# Patient Record
Sex: Female | Born: 1985 | Race: White | Hispanic: No | Marital: Married | Smoking: Never smoker
Health system: Southern US, Community
[De-identification: ages and names within clinical notes are randomized; demographics above are authoritative.]

## PROBLEM LIST (undated history)

## (undated) DIAGNOSIS — O139 Gestational [pregnancy-induced] hypertension without significant proteinuria, unspecified trimester: Secondary | ICD-10-CM

## (undated) DIAGNOSIS — R51 Headache: Secondary | ICD-10-CM

## (undated) DIAGNOSIS — C801 Malignant (primary) neoplasm, unspecified: Secondary | ICD-10-CM

## (undated) DIAGNOSIS — R519 Headache, unspecified: Secondary | ICD-10-CM

## (undated) HISTORY — PX: WISDOM TOOTH EXTRACTION: SHX21

## (undated) HISTORY — PX: SKIN CANCER EXCISION: SHX779

---

## 2013-02-04 ENCOUNTER — Other Ambulatory Visit: Payer: Self-pay | Admitting: Family

## 2013-02-04 LAB — CBC WITH DIFFERENTIAL/PLATELET
Basophil %: 0.2 %
Eosinophil %: 0.8 %
HCT: 41.1 % (ref 35.0–47.0)
HGB: 14.2 g/dL (ref 12.0–16.0)
Lymphocyte %: 28.9 %
MCH: 29.4 pg (ref 26.0–34.0)
MCHC: 34.5 g/dL (ref 32.0–36.0)
MCV: 85 fL (ref 80–100)
Neutrophil %: 66 %
Platelet: 292 10*3/uL (ref 150–440)
RBC: 4.81 10*6/uL (ref 3.80–5.20)
RDW: 13.1 % (ref 11.5–14.5)
WBC: 9.1 10*3/uL (ref 3.6–11.0)

## 2013-02-04 LAB — COMPREHENSIVE METABOLIC PANEL
Albumin: 4.1 g/dL (ref 3.4–5.0)
Anion Gap: 4 — ABNORMAL LOW (ref 7–16)
BUN: 15 mg/dL (ref 7–18)
Bilirubin,Total: 0.3 mg/dL (ref 0.2–1.0)
Co2: 27 mmol/L (ref 21–32)
Creatinine: 0.64 mg/dL (ref 0.60–1.30)
EGFR (African American): >60
EGFR (Non-African Amer.): >60
Osmolality: 272 (ref 275–301)
SGPT (ALT): 34 U/L (ref 12–78)

## 2014-04-29 ENCOUNTER — Ambulatory Visit: Payer: Self-pay | Admitting: Nurse Practitioner

## 2014-06-19 ENCOUNTER — Inpatient Hospital Stay: Payer: Self-pay | Admitting: Obstetrics & Gynecology

## 2014-06-19 LAB — CBC WITH DIFFERENTIAL/PLATELET
Basophil #: 0.1 10*3/uL (ref 0.0–0.1)
Basophil %: 0.3 %
Eosinophil #: 0 10*3/uL (ref 0.0–0.7)
Eosinophil %: 0 %
HCT: 40.4 % (ref 35.0–47.0)
HGB: 13.2 g/dL (ref 12.0–16.0)
Lymphocyte #: 1.4 10*3/uL (ref 1.0–3.6)
Lymphocyte %: 5.1 %
MCH: 27.2 pg (ref 26.0–34.0)
MCHC: 32.7 g/dL (ref 32.0–36.0)
MCV: 83 fL (ref 80–100)
MONO ABS: 1.2 x10 3/mm — AB (ref 0.2–0.9)
MONOS PCT: 4.1 %
NEUTROS ABS: 25.2 10*3/uL — AB (ref 1.4–6.5)
Neutrophil %: 90.5 %
Platelet: 255 10*3/uL (ref 150–440)
RBC: 4.86 10*6/uL (ref 3.80–5.20)
RDW: 14.4 % (ref 11.5–14.5)
WBC: 27.9 10*3/uL — AB (ref 3.6–11.0)

## 2014-06-21 LAB — HEMATOCRIT: HCT: 36.7 % (ref 35.0–47.0)

## 2014-09-15 LAB — SURGICAL PATHOLOGY

## 2014-09-30 NOTE — H&P (Signed)
L&D Evaluation:  History:  H[facility] 29 [facility]2P0010 [facility] presenting for contractions starting this morning.  No LOF, no VB, +FM.  Pregnancy uncomplicated to date.  PMH noteabel for melanoma, placenta to pathology for diagnosis.   Presents with contractions   Patient's Medical History Melanoma, obesity BMI 31, IBS   Patient's Surgical History colonoscopy   Medications Pre Natal Vitamins   Allergies NKDA   Social History none   Family History Non-Contributory   ROS:  ROS All systems were reviewed.  HEENT, CNS, GI, GU, Respiratory, CV, Renal and Musculoskeletal systems were found to be normal.   Exam:  Vital Signs stable   Urine Protein not completed   General painfully contracting   Mental Status clear   Chest no increased work of breathing   Abdomen gravid, tender with contractions   Estimated Fetal Weight Average for gestational age   Fetal Position vtx   Back no CVAT   Pelvic 3/50/-3   Mebranes Intact   FHT normal rate with no decels   Ucx regular, q578m   Impression:  Impression 2610o G2P0010 at 4074w4dstation with braxton hick contractions vs early labor   Plan:  Plan EFM/NST, monitor contractions and for cervical change   Comments 1) R/O labor - unchanged over the last 4-hrs, relatively uncomfortable will recheck in 1-hr - additional 6mg16m morpine received 10mg21mrs ago with partial relieve of symptoms  2) Fetus - category I tracing  3) AB positive / ABSC neg / RNI / VZI / HIV neg / HBsAg neg / GBS neg 05/21/15  4) Dispositon pending recheck - needs MMR   Electronic Signatures: StaebDorthula Nettles  (Signed 28-Jan-16 16:07)  Authored: L&D Evaluation   Last Updated: 28-Jan-16 16:07 by StaebDorthula Nettles

## 2017-11-27 NOTE — Telephone Encounter (Signed)
This encounter was created in error - please disregard.

## 2018-05-23 NOTE — L&D Delivery Note (Signed)
Date of delivery: 01/03/2019 Estimated Date of Delivery: 12/31/18 Patient's last menstrual period was 03/26/2018. EGA: [redacted]w[redacted]d  Delivery Note At 3:09 AM a viable female was delivered via Vaginal, Spontaneous (Presentation: cephalic; ROA).  APGAR: 8, 9; weight pending skin-to-skin.   Placenta status: spontaneous, intact, with eccenric cord insertion, normal appearing vascularture, slightly pale, with multiple infarcts on maternal surface.  No evidence of melanoma on either surface.  Cord: 3vv,  with the following complications: none apparent. Thick tarry meconium.  Cord pH: declined by peds  Anesthesia:  epidural Episiotomy: None Lacerations: Labial (left) Suture Repair: 3.0 vicryl rapide Est. Blood Loss (mL): 3cc   Mom presented to L&D with perisistent high blood pressures in the office.  She was given cytotec buccally and cervically for cervical ripening, and spontaneous labor progressed from there.  epidual placed. AROM'd for particulate meconium.  Progressed to complete, second stage:  delivery of fetal head with restitution to ROT.   Anterior then posterior shoulders delivered without difficulty, with thick tarry meconium following.  Baby placed on mom's chest, and attended to by peds.  Cord was then clamped and cut by FOB when pulseless.  Placenta spontaneously delivered, intact.   IV pitocin given for hemorrhage prophylaxis. Vaginal and left labial lac repaired with 3-0 rapide in a continuous stitch.    We sang happy birthday to baby Glennon Mac.  Mom to postpartum.  Baby to Couplet care / Skin to Skin.  Chelsea C Ward 01/03/2019, 4:21 AM

## 2018-06-18 ENCOUNTER — Other Ambulatory Visit: Payer: Self-pay | Admitting: Certified Nurse Midwife

## 2018-06-18 DIAGNOSIS — Z369 Encounter for antenatal screening, unspecified: Secondary | ICD-10-CM

## 2018-06-28 ENCOUNTER — Ambulatory Visit (HOSPITAL_BASED_OUTPATIENT_CLINIC_OR_DEPARTMENT_OTHER)
Admission: RE | Admit: 2018-06-28 | Discharge: 2018-06-28 | Disposition: A | Discharge status: Home / Self Care | Payer: BC Managed Care – PPO | Source: Ambulatory Visit | Attending: Maternal & Fetal Medicine | Admitting: Maternal & Fetal Medicine

## 2018-06-28 ENCOUNTER — Ambulatory Visit
Admission: RE | Admit: 2018-06-28 | Discharge: 2018-06-28 | Disposition: A | Discharge status: Home / Self Care | Payer: BC Managed Care – PPO | Source: Ambulatory Visit | Attending: Maternal & Fetal Medicine | Admitting: Maternal & Fetal Medicine

## 2018-06-28 VITALS — BP 133/70 | HR 92 | Temp 98.0°F | Resp 18 | Wt 192.0 lb

## 2018-06-28 DIAGNOSIS — Z369 Encounter for antenatal screening, unspecified: Secondary | ICD-10-CM

## 2018-06-28 DIAGNOSIS — Z3682 Encounter for antenatal screening for nuchal translucency: Secondary | ICD-10-CM | POA: Diagnosis present

## 2018-06-28 DIAGNOSIS — Z3A13 13 weeks gestation of pregnancy: Secondary | ICD-10-CM | POA: Insufficient documentation

## 2018-06-28 HISTORY — DX: Headache: R51

## 2018-06-28 HISTORY — DX: Malignant (primary) neoplasm, unspecified: C80.1

## 2018-06-28 HISTORY — DX: Headache, unspecified: R51.9

## 2018-06-28 NOTE — Progress Notes (Signed)
Referring physician:  Laverta Baltimore Length of Consultation: 40 minutes   Karen Wise  was referred to [facility] for genetic counseling to review prenatal screening and testing options.  This note summarizes the information we discussed.    We offered the following routine screening tests for this pregnancy:  First trimester screening, which includes nuchal translucency ultrasound screen and first trimester maternal serum marker screening.  The nuchal translucency has approximately an 80% detection rate for Down syndrome and can be positive for other chromosome abnormalities as well as congenital heart defects.  When combined with a maternal serum marker screening, the detection rate is up to 90% for Down syndrome and up to 97% for trisomy 18.     Maternal serum marker screening, a blood test that measures pregnancy proteins, can provide risk assessments for Down syndrome, trisomy 18, and open neural tube defects (spina bifida, anencephaly). Because it does not directly examine the fetus, it cannot positively diagnose or rule out these problems.  Targeted ultrasound uses high frequency sound waves to create an image of the developing fetus.  An ultrasound is often recommended as a routine means of evaluating the pregnancy.  It is also used to screen for fetal anatomy problems (for example, a heart defect) that might be suggestive of a chromosomal or other abnormality.   Should these screening tests indicate an increased concern, then the following additional testing options would be offered:  The chorionic villus sampling procedure is available for first trimester chromosome analysis.  This involves the withdrawal of a small amount of chorionic villi (tissue from the developing placenta).  Risk of pregnancy loss is estimated to be approximately 1 in 200 to 1 in 100 (0.5 to 1%).  There is approximately a 1% (1 in 100) chance that the CVS chromosome results will be  unclear.  Chorionic villi cannot be tested for neural tube defects.     Amniocentesis involves the removal of a small amount of amniotic fluid from the sac surrounding the fetus with the use of a thin needle inserted through the maternal abdomen and uterus.  Ultrasound guidance is used throughout the procedure.  Fetal cells from amniotic fluid are directly evaluated and > 99.5% of chromosome problems and > 98% of open neural tube defects can be detected. This procedure is generally performed after the 15th week of pregnancy.  The main risks to this procedure include complications leading to miscarriage in less than 1 in 200 cases (0.5%).  As another option for information if the pregnancy is suspected to be an an increased chance for certain chromosome conditions, we also reviewed the availability of cell free fetal DNA testing from maternal blood to determine whether or not the baby may have either Down syndrome, trisomy 21, or trisomy 30.  This test utilizes a maternal blood sample and DNA sequencing technology to isolate circulating cell free fetal DNA from maternal plasma.  The fetal DNA can then be analyzed for DNA sequences that are derived from the three most common chromosomes involved in aneuploidy, chromosomes 13, 18, and 21.  If the overall amount of DNA is greater than the expected level for any of these chromosomes, aneuploidy is suspected.  While we do not consider it a replacement for invasive testing and karyotype analysis, a negative result from this testing would be reassuring, though not a guarantee of a normal chromosome complement for the baby.  An abnormal result is certainly suggestive of an abnormal chromosome complement, though  we would still recommend CVS or amniocentesis to confirm any findings from this testing.  Cystic Fibrosis and Spinal Muscular Atrophy (SMA) screening were also discussed with the patient. Both conditions are recessive, which means that both parents must be  carriers in order to have a child with the disease.  Cystic fibrosis (CF) is one of the most common genetic conditions in persons of Caucasian ancestry.  This condition occurs in approximately 1 in 2,500 Caucasian persons and results in thickened secretions in the lungs, digestive, and reproductive systems.  For a baby to be at risk for having CF, both of the parents must be carriers for this condition.  Approximately 1 in 62 Caucasian persons is a carrier for CF.  Current carrier testing looks for the most common mutations in the gene for CF and can detect approximately 90% of carriers in the Caucasian population.  This means that the carrier screening can greatly reduce, but cannot eliminate, the chance for an individual to have a child with CF.  If an individual is found to be a carrier for CF, then carrier testing would be available for the partner. As part of [place] newborn screening profile, all babies born in the state of [place] will have a two-tier screening process.  Specimens are first tested to determine the concentration of immunoreactive trypsinogen (IRT).  The top 5% of specimens with the highest IRT values then undergo DNA testing using a panel of over 40 common CF mutations. SMA is a neurodegenerative disorder that leads to atrophy of skeletal muscle and overall weakness.  This condition is also more prevalent in the Caucasian population, with 1 in 40-1 in 60 persons being a carrier and 1 in 6,000-1 in 10,000 children being affected.  There are multiple forms of the disease, with some causing death in infancy to other forms with survival into adulthood.  The genetics of SMA is complex, but carrier screening can detect up to 95% of carriers in the Caucasian population.  Similar to CF, a negative result can greatly reduce, but cannot eliminate, the chance to have a child with SMA.  We obtained a detailed family history and pregnancy history.  Ms. Mullings reported that she and her  father have both had melanoma and several other family members have had multiple other types of skin cancer.  In addition, her paternal grandmother had both breast and ovarian cancer, though it is unclear if this was two different primary tumors or a metastasis.  Her maternal grandmother had colon cancer.  We reviewed that most cancers occur by chance.  When there is a strong family history of cancers, particularly if individuals are diagnosed at young ages or multiple family members have the same type of cancer, there is more likely to be an inherited predisposition.  If at any time Ms. Meine would like to speak with a cancer genetic counselor about this history, we would be happy to provide her with contact information for a hereditary cancer clinic. Also of note, the father of the baby is said to have had several paternal family members with brain aneurisms including both grandparents and an aunt or uncle.  We noted that there are some families with an inherited cause for aneurism and if more is learned about the cause for there conditions we are happy to discuss this further. The remainder of the family history was reported to be unremarkable for birth defects, intellectual delays, recurrent pregnancy loss or known chromosome abnormalities.  Ms. Hammer reported  that this is the third pregnancy for she and her partner, Everlean Patterson.  Their first pregnancy resulted in an early miscarriage, and they have a 44 year old son who is in good health.  She reported no complications or exposures in this pregnancy that would be expected to increase the risk for birth defects.  After consideration of the options, Ms. Pippins elected to proceed with first trimester screening.  She declined carrier screening for CF and SMA.  An ultrasound was performed at the time of the visit.  The gestational age was consistent with 41 weeks.  Fetal anatomy could not be assessed due to early gestational age.  Please refer to the ultrasound  report for details of that study.  Ms. Longo was encouraged to call with questions or concerns.  We can be contacted at [phone].  Labs ordered: first trimester screening  Wilburt Finlay, MS, CGC

## 2018-07-03 ENCOUNTER — Telehealth: Payer: Self-pay | Admitting: Obstetrics and Gynecology

## 2018-07-03 NOTE — Telephone Encounter (Signed)
The results of the First Trimester Nuchal Translucency and Biochemical Screening are now available.  These results showed an increased risk for Down Syndrome.  Specifically, the risk for Down syndrome is now 1 in 66 (the prior risk based upon age alone was 1 in 73).  The risk for Trisomy 13/18 is estimated to be 1 in 2,595.  As we discussed when we called with these results, if more definitive information is desired, we would offer chorionic villus sampling or amniocentesis.  Because we do not yet know the effectiveness of combined first and second trimester screening, we do not recommend a maternal serum screen to assess the chance for chromosome conditions.  However, if screening for neural tube defects is desired, maternal serum screening for AFP only can be performed between 15 and [redacted] weeks gestation.    We also reviewed the availability of cell free fetal DNA testing from maternal blood to determine whether or not the baby may have either Down syndrome, trisomy 44, or trisomy 71.  This test utilizes a maternal blood sample and DNA sequencing technology to isolate circulating cell free fetal DNA from maternal plasma.  The fetal DNA can then be analyzed for DNA sequences that are derived from the three most common chromosomes involved in aneuploidy, chromosomes 13, 18, and 21.  If the overall amount of DNA is greater than the expected level for any of these chromosomes, aneuploidy is suspected.  While we do not consider it a replacement for invasive testing and karyotype analysis, a negative result from this testing would be reassuring, though not a guarantee of a normal chromosome complement for the baby.  An abnormal result is certainly suggestive of an abnormal chromosome complement, though we would still recommend CVS or amniocentesis to confirm any findings from this testing  The patient elected to return to [facility] on 07[facility]/18 to review the results and have MaterniT21 testing  drawn.  The patient was encouraged to contact us with any questions.  We may be reached at [phone].   Wilburt Finlay, MS, CGC

## 2018-07-05 ENCOUNTER — Ambulatory Visit
Admission: RE | Admit: 2018-07-05 | Discharge: 2018-07-05 | Disposition: A | Discharge status: Home / Self Care | Payer: BC Managed Care – PPO | Source: Ambulatory Visit | Attending: Maternal & Fetal Medicine | Admitting: Maternal & Fetal Medicine

## 2018-07-05 DIAGNOSIS — O281 Abnormal biochemical finding on antenatal screening of mother: Secondary | ICD-10-CM | POA: Insufficient documentation

## 2018-07-05 NOTE — Progress Notes (Signed)
I met with Ms. Leiber and her partner to review the results of first trimester screening which showed a risk of 1 in 12 for Down syndrome.  We reviewed the option of cell free fetal DNA testing, amniocentesis or ultrasound to provide additional risk assessment.  They elected to proceed with MaterniT21 PLUS with SCA testing today and will consider amniocentesis pending those results. Legal limits for decision making in pregnancy were also discussed.   Results should be available in approximately 1 week and will be conveyed directly to the patient. We may be reached by phone at [phone].  Wilburt Finlay, MS, CGC

## 2018-07-10 LAB — MATERNIT21 PLUS CORE+SCA
Chromosome 13: NEGATIVE
Chromosome 18: NEGATIVE
Chromosome 21: NEGATIVE
Y Chromosome: DETECTED

## 2018-07-12 ENCOUNTER — Telehealth: Payer: Self-pay | Admitting: Obstetrics and Gynecology

## 2018-07-12 NOTE — Telephone Encounter (Signed)
The patient was informed of the results of her recent MaterniT21 testing which yielded NEGATIVE results.  The patient's specimen showed DNA consistent with two copies of chromosomes 21, 18 and 13.  The sensitivity for trisomy 31, trisomy 7 and trisomy 3 using this testing are reported as 99.1%, 99.9% and 91.7% respectively.  Thus, while the results of this testing are highly accurate, they are not considered diagnostic at this time.  Should more definitive information be desired, the patient may still consider amniocentesis.   As requested to know by the patient, sex chromosome analysis was included for this sample.  Results are consistent with a female fetus (Y chromosome material was detected). This is predicted with >99% accuracy.  A maternal serum AFP only should be considered if screening for neural tube defects is desired.  After consideration of these results following her abnormal first trimester screening, Ms. Carrell declined any additional testing for chromosome conditions at this time.  She plans to continue with her anatomy ultrasound at [facility] and will contact our office if needed. We may be reached at [phone] with any questions or concerns.   Wilburt Finlay, MS, CGC

## 2018-07-16 NOTE — Progress Notes (Signed)
Pt seen by me, agree with assessment and plan as outlined in [facility]'s note.

## 2018-12-12 ENCOUNTER — Observation Stay
Admission: EM | Admit: 2018-12-12 | Discharge: 2018-12-12 | Disposition: A | Discharge status: Home / Self Care | Payer: BC Managed Care – PPO | Attending: Obstetrics and Gynecology | Admitting: Obstetrics and Gynecology

## 2018-12-12 ENCOUNTER — Other Ambulatory Visit: Payer: Self-pay | Admitting: Obstetrics and Gynecology

## 2018-12-12 ENCOUNTER — Other Ambulatory Visit: Payer: Self-pay

## 2018-12-12 DIAGNOSIS — Z85828 Personal history of other malignant neoplasm of skin: Secondary | ICD-10-CM | POA: Diagnosis not present

## 2018-12-12 DIAGNOSIS — Z3A37 37 weeks gestation of pregnancy: Secondary | ICD-10-CM | POA: Insufficient documentation

## 2018-12-12 DIAGNOSIS — R42 Dizziness and giddiness: Secondary | ICD-10-CM | POA: Insufficient documentation

## 2018-12-12 DIAGNOSIS — O26893 Other specified pregnancy related conditions, third trimester: Secondary | ICD-10-CM | POA: Diagnosis not present

## 2018-12-12 DIAGNOSIS — R030 Elevated blood-pressure reading, without diagnosis of hypertension: Secondary | ICD-10-CM | POA: Diagnosis present

## 2018-12-12 LAB — COMPREHENSIVE METABOLIC PANEL
ALT: 9 U/L (ref 0–44)
AST: 13 U/L — ABNORMAL LOW (ref 15–41)
Albumin: 2.9 g/dL — ABNORMAL LOW (ref 3.5–5.0)
Alkaline Phosphatase: 77 U/L (ref 38–126)
Anion gap: 9 (ref 5–15)
BUN: 8 mg/dL (ref 6–20)
CO2: 20 mmol/L — ABNORMAL LOW (ref 22–32)
Calcium: 8.4 mg/dL — ABNORMAL LOW (ref 8.9–10.3)
Chloride: 106 mmol/L (ref 98–111)
Creatinine, Ser: 0.51 mg/dL (ref 0.44–1.00)
GFR calc Af Amer: >60 mL/min (ref >60)
GFR calc non Af Amer: >60 mL/min (ref >60)
Glucose, Bld: 120 mg/dL — ABNORMAL HIGH (ref 70–99)
Potassium: 3.5 mmol/L (ref 3.5–5.1)
Sodium: 135 mmol/L (ref 135–145)
Total Bilirubin: 0.2 mg/dL — ABNORMAL LOW (ref 0.3–1.2)
Total Protein: 6.3 g/dL — ABNORMAL LOW (ref 6.5–8.1)

## 2018-12-12 LAB — CBC
HCT: 31.9 % — ABNORMAL LOW (ref 36.0–46.0)
Hemoglobin: 10.3 g/dL — ABNORMAL LOW (ref 12.0–15.0)
MCH: 25.8 pg — ABNORMAL LOW (ref 26.0–34.0)
MCHC: 32.3 g/dL (ref 30.0–36.0)
MCV: 79.8 fL — ABNORMAL LOW (ref 80.0–100.0)
Platelets: 273 10*3/uL (ref 150–400)
RBC: 4 MIL/uL (ref 3.87–5.11)
RDW: 13.5 % (ref 11.5–15.5)
WBC: 13.4 10*3/uL — ABNORMAL HIGH (ref 4.0–10.5)
nRBC: 0 % (ref 0.0–0.2)

## 2018-12-12 LAB — SAMPLE TO BLOOD BANK

## 2018-12-12 LAB — PROTEIN / CREATININE RATIO, URINE
Creatinine, Urine: 101 mg/dL
Protein Creatinine Ratio: 0.08 mg/mg{Cre} (ref 0.00–0.15)
Total Protein, Urine: 8 mg/dL

## 2018-12-12 NOTE — Progress Notes (Signed)
Elevated BP in office today. Sent to triage for [facility] eval.

## 2018-12-12 NOTE — Discharge Summary (Signed)
Patient ID: Karen Wise, female   DOB: [birth year], 33 y.o.   MRN: [id number]     Subjective    Karen Wise is a 33 y.o. female. She is at [redacted]w[redacted]d gestation. Patient's last menstrual period was 03/26/2018.  Estimated Date of Delivery: 12/31/18     Prenatal care site: [facility]   Chief complaint:dizziness, RUQ pain      Location:  Onset/timing:  Duration:  Quality:   Severity:  Aggravating or alleviating conditions:  Associated signs/symptoms:  Context:     S: Resting comfortably. no CTX, no VB.no LOF,  Active fetal movement.       Maternal Medical History:            Past Medical History:    Diagnosis   Date    .   Cancer [facility])            Melonoma 2009    .   Headache            Migraines (1 a month)               Past Surgical History:    Procedure   Laterality   Date    .   SKIN CANCER EXCISION   Left            Shoulder and Lymph Nodes    .   WISDOM TOOTH EXTRACTION   Bilateral                  Allergies    Allergen   Reactions    .   Bee Venom   Swelling                 Prior to Admission medications     Medication   Sig   Start Date   End Date   Taking?   Authorizing Provider    pantoprazole (PROTONIX) 40 MG tablet   Take 40 mg by mouth daily.           Yes   [provider]    Prenatal Vit-Fe Fumarate-FA (PRENATAL MULTIVITAMIN) TABS tablet   Take 1 tablet by mouth daily at 12 noon.           Yes   [provider]    ondansetron (ZOFRAN-ODT) 4 MG disintegrating tablet   Take 4 mg by mouth every 8 (eight) hours as needed for nausea or vomiting.               [provider]             Social History: She  reports that she has never smoked. She has never used smokeless tobacco. She reports previous alcohol use. She reports that she does not use  drugs.     Family History: family history includes ADD / ADHD in her brother and brother; Cancer in her father, maternal grandmother, mother, and paternal grandmother; Heart disease in her maternal grandfather; Hypertension in her maternal grandfather.  no history of gyn cancers     Review of Systems: A full review of systems was performed and negative except as noted in the HPI.       Review of Systems: A full review of systems was performed and negative except as noted in the HPI.    Eyes: no vision change   Ears: left ear pain   Oropharynx: no sore throat   Pulmonary . No shortness of breath , no hemoptysis  Cardiovascular: no chest pain , no irregular heart beat   Gastrointestinal:no blood in stool . No diarrhea, no constipation  Uro gynecologic: no dysuria , no pelvic pain  Neurologic : no seizure , no migraines     Musculoskeletal: no muscular weakness     O:       Objective                                                                                                                                                                                                                                                                                                                                                                                                                                                                                                                                                         Constitutional: NAD, AAOx3   HE/ENT: extraocular  movements grossly intact, moist mucous  membranes  CV: RRR  PULM: nl respiratory effort, CTABL                                          Abd: gravid, non-tender, non-distended, soft                                                   Ext: Non-tender, Nonedmeatous                      Psych: mood appropriate, speech normal  Pelvic deferred     NST:- reactive   Baseline: 140  Variability: moderate  Accelerations present x >2  Decelerations absent  Time 67mins           Assessment: 33 y.o. [redacted]w[redacted]d here for antenatal surveillance during pregnancy.     Principle diagnosis: dizziness     Plan:  .Labor: not present.  No evidence of Preeclampsia    .Fetal Wellbeing: Reassuring Cat 1 tracing.   .Reactive NST    .D/c home stable, precautions reviewed, follow-up as scheduled.    Precautions given   -----  Huel Cote MD  Attending Obstetrician and Gynecologist  [facility], Department of [facility]             Electronically signed by Kourtlynn Trevor, Gwen Her, MD at 12/12/2018  5:27 PM

## 2018-12-12 NOTE — OB Triage Note (Signed)
Provider reviewed labs and okay to discharge. Pt verbalized understanding of discharge instructions and has no further questions at this time.

## 2018-12-12 NOTE — Final Progress Note (Signed)
Patient ID: Karen Wise, female   DOB: [birth year], 33 y.o.   MRN: [id number]  Karen Wise is a 33 y.o. female. She is at [redacted]w[redacted]d gestation. Patient's last menstrual period was 03/26/2018. Estimated Date of Delivery: 12/31/18  Prenatal care site: [facility]  Chief complaint:dizziness, RUQ pain   Location: Onset/timing: Duration: Quality:  Severity: Aggravating or alleviating conditions: Associated signs/symptoms: Context:  S: Resting comfortably. no CTX, no VB.no LOF,  Active fetal movement.   Maternal Medical History:   Past Medical History:  Diagnosis Date  . Cancer [facility])    Melonoma 2009  . Headache    Migraines (1 a month)    Past Surgical History:  Procedure Laterality Date  . SKIN CANCER EXCISION Left    Shoulder and Lymph Nodes  . WISDOM TOOTH EXTRACTION Bilateral     Allergies  Allergen Reactions  . Bee Venom Swelling    Prior to Admission medications   Medication Sig Start Date End Date Taking? Authorizing Provider  pantoprazole (PROTONIX) 40 MG tablet Take 40 mg by mouth daily.   Yes [provider]  Prenatal Vit-Fe Fumarate-FA (PRENATAL MULTIVITAMIN) TABS tablet Take 1 tablet by mouth daily at 12 noon.   Yes [provider]  ondansetron (ZOFRAN-ODT) 4 MG disintegrating tablet Take 4 mg by mouth every 8 (eight) hours as needed for nausea or vomiting.    [provider]     Social History: She  reports that she has never smoked. She has never used smokeless tobacco. She reports previous alcohol use. She reports that she does not use drugs.  Family History: family history includes ADD / ADHD in her brother and brother; Cancer in her father, maternal grandmother, mother, and paternal grandmother; Heart disease in her maternal grandfather; Hypertension in her maternal grandfather.  no history of gyn cancers  Review of Systems: A full review of systems was performed and negative except as noted in the HPI.     Review of Systems: A full review of systems was performed and negative except as noted in the HPI.   Eyes: no vision change  Ears: left ear pain  Oropharynx: no sore throat  Pulmonary . No shortness of breath , no hemoptysis Cardiovascular: no chest pain , no irregular heart beat  Gastrointestinal:no blood in stool . No diarrhea, no constipation Uro gynecologic: no dysuria , no pelvic pain Neurologic : no seizure , no migraines    Musculoskeletal: no muscular weakness  O:  BP 130/80   Pulse 85   Temp 98.7 F (37.1 C) (Oral)   Resp 16   Ht 5\' 6"  (1.676 m)   Wt 102.1 kg   LMP 03/26/2018   BMI 36.32 kg/m  Results for orders placed or performed during the hospital encounter of 12/12/18 (from the past 48 hour(s))  Protein / creatinine ratio, urine   Collection Time: 12/12/18  3:04 PM  Result Value Ref Range   Creatinine, Urine 101 mg/dL   Total Protein, Urine 8 mg/dL   Protein Creatinine Ratio 0.08 0.00 - 0.15 mg/mg[Cre]  CBC on admission   Collection Time: 12/12/18  3:28 PM  Result Value Ref Range   WBC 13.4 (H) 4.0 - 10.5 K/uL   RBC 4.00 3.87 - 5.11 MIL/uL   Hemoglobin 10.3 (L) 12.0 - 15.0 g/dL   HCT 31.9 (L) 36.0 - 46.0 %   MCV 79.8 (L) 80.0 - 100.0 fL   MCH 25.8 (L) 26.0 - 34.0 pg   MCHC 32.3  30.0 - 36.0 g/dL   RDW 13.5 11.5 - 15.5 %   Platelets 273 150 - 400 K/uL   nRBC 0.0 0.0 - 0.2 %  Comprehensive metabolic panel   Collection Time: 12/12/18  3:28 PM  Result Value Ref Range   Sodium 135 135 - 145 mmol/L   Potassium 3.5 3.5 - 5.1 mmol/L   Chloride 106 98 - 111 mmol/L   CO2 20 (L) 22 - 32 mmol/L   Glucose, Bld 120 (H) 70 - 99 mg/dL   BUN 8 6 - 20 mg/dL   Creatinine, Ser 0.51 0.44 - 1.00 mg/dL   Calcium 8.4 (L) 8.9 - 10.3 mg/dL   Total Protein 6.3 (L) 6.5 - 8.1 g/dL   Albumin 2.9 (L) 3.5 - 5.0 g/dL   AST 13 (L) 15 - 41 U/L   ALT 9 0 - 44 U/L   Alkaline Phosphatase 77 38 - 126 U/L   Total Bilirubin 0.2 (L) 0.3 - 1.2 mg/dL   GFR calc non Af Amer >60 >60  mL/min   GFR calc Af Amer >60 >60 mL/min   Anion gap 9 5 - 15  Sample to Blood Bank   Collection Time: 12/12/18  3:29 PM  Result Value Ref Range   Blood Bank Specimen SAMPLE AVAILABLE FOR TESTING    Sample Expiration      12/15/2018,2359 Performed at [facility], [address] [place], [place] [address]      Constitutional: NAD, AAOx3  HE/ENT: extraocular movements grossly intact, moist mucous membranes CV: RRR PULM: nl respiratory effort, CTABL     Abd: gravid, non-tender, non-distended, soft      Ext: Non-tender, Nonedmeatous   Psych: mood appropriate, speech normal Pelvic deferred  NST:  Baseline:  Variability: moderate Accelerations present x >2 Decelerations absent Time 2mins    Assessment: 33 y.o. [redacted]w[redacted]d here for antenatal surveillance during pregnancy.  Principle diagnosis: dizziness  Plan:  Labor: not present.  No evidence of Preeclampsia   Fetal Wellbeing: Reassuring Cat 1 tracing.  Reactive NST   D/c home stable, precautions reviewed, follow-up as scheduled.  Precautions given  ----- Huel Cote MD Attending Obstetrician and Gynecologist [facility], Department of [facility]

## 2018-12-12 NOTE — OB Triage Note (Addendum)
Pt is a 33yo G3P1 at [redacted]w[redacted]d that was sent over from office for PIH eval. Pt states she has had headaches and dizziness on and off since Saturday. Pt states she has taken tylenol at home that gives temporary relief but she states "she feels guilty for taking meds so I don't like to take them." Pt states she has blurred vision that relates to the dizziness. Pt wears contacts. Pt also reports epigastric pain. Pt denies VB, LOF and states positive fetal movement. Initial BP 143/81 and cycling q 52min. Initial FHT 145 with monitors applied and assessing. Reflexes plus 2, no clonus, no pitting edema noted on exam.

## 2019-01-02 ENCOUNTER — Other Ambulatory Visit: Payer: Self-pay

## 2019-01-02 ENCOUNTER — Inpatient Hospital Stay
Admission: RE | Admit: 2019-01-02 | Discharge: 2019-01-04 | DRG: 807 | Disposition: A | Discharge status: Home / Self Care | Payer: BC Managed Care – PPO | Attending: Obstetrics & Gynecology | Admitting: Obstetrics & Gynecology

## 2019-01-02 DIAGNOSIS — O480 Post-term pregnancy: Secondary | ICD-10-CM | POA: Diagnosis present

## 2019-01-02 DIAGNOSIS — R030 Elevated blood-pressure reading, without diagnosis of hypertension: Secondary | ICD-10-CM | POA: Diagnosis present

## 2019-01-02 DIAGNOSIS — Z85820 Personal history of malignant melanoma of skin: Secondary | ICD-10-CM

## 2019-01-02 DIAGNOSIS — O99214 Obesity complicating childbirth: Secondary | ICD-10-CM | POA: Diagnosis present

## 2019-01-02 DIAGNOSIS — Z20828 Contact with and (suspected) exposure to other viral communicable diseases: Secondary | ICD-10-CM | POA: Diagnosis present

## 2019-01-02 DIAGNOSIS — O43123 Velamentous insertion of umbilical cord, third trimester: Secondary | ICD-10-CM | POA: Diagnosis present

## 2019-01-02 DIAGNOSIS — E669 Obesity, unspecified: Secondary | ICD-10-CM | POA: Diagnosis present

## 2019-01-02 DIAGNOSIS — Z3A40 40 weeks gestation of pregnancy: Secondary | ICD-10-CM | POA: Diagnosis not present

## 2019-01-02 DIAGNOSIS — O26893 Other specified pregnancy related conditions, third trimester: Secondary | ICD-10-CM | POA: Diagnosis present

## 2019-01-02 HISTORY — DX: Gestational (pregnancy-induced) hypertension without significant proteinuria, unspecified trimester: O13.9

## 2019-01-02 LAB — PROTEIN / CREATININE RATIO, URINE
Creatinine, Urine: 42 mg/dL
Total Protein, Urine: <6 mg/dL

## 2019-01-02 LAB — CBC
HCT: 32 % — ABNORMAL LOW (ref 36.0–46.0)
Hemoglobin: 10.3 g/dL — ABNORMAL LOW (ref 12.0–15.0)
MCH: 25.3 pg — ABNORMAL LOW (ref 26.0–34.0)
MCHC: 32.2 g/dL (ref 30.0–36.0)
MCV: 78.6 fL — ABNORMAL LOW (ref 80.0–100.0)
Platelets: 272 10*3/uL (ref 150–400)
RBC: 4.07 MIL/uL (ref 3.87–5.11)
RDW: 14.3 % (ref 11.5–15.5)
WBC: 16.4 10*3/uL — ABNORMAL HIGH (ref 4.0–10.5)
nRBC: 0 % (ref 0.0–0.2)

## 2019-01-02 LAB — SARS CORONAVIRUS 2 BY RT PCR (HOSPITAL ORDER, PERFORMED IN ~~LOC~~ HOSPITAL LAB): SARS Coronavirus 2: NEGATIVE

## 2019-01-02 LAB — TYPE AND SCREEN
ABO/RH(D): AB POS
Antibody Screen: NEGATIVE

## 2019-01-02 MED ORDER — ONDANSETRON HCL 4 MG/2ML IJ SOLN
4.0000 mg | Freq: Four times a day (QID) | INTRAMUSCULAR | Status: DC | PRN
  Administered 2019-01-03: 4 mg via INTRAVENOUS
  Filled 2019-01-02: qty 2

## 2019-01-02 MED ORDER — ACETAMINOPHEN 500 MG PO TABS: 1000.0000 mg | ORAL_TABLET | Freq: Four times a day (QID) | ORAL | Status: DC | PRN

## 2019-01-02 MED ORDER — BUTORPHANOL TARTRATE 1 MG/ML IJ SOLN: 1.0000 mg | INTRAMUSCULAR | Status: DC | PRN

## 2019-01-02 MED ORDER — MISOPROSTOL 200 MCG PO TABS
ORAL_TABLET | ORAL | Status: AC
  Filled 2019-01-02: qty 4

## 2019-01-02 MED ORDER — FENTANYL 2.5 MCG/ML W/ROPIVACAINE 0.15% IN NS 100 ML EPIDURAL (ARMC)
EPIDURAL | Status: AC
  Filled 2019-01-02: qty 100

## 2019-01-02 MED ORDER — LIDOCAINE HCL (PF) 1 % IJ SOLN
INTRAMUSCULAR | Status: AC
  Filled 2019-01-02: qty 30

## 2019-01-02 MED ORDER — OXYTOCIN BOLUS FROM INFUSION
500.0000 mL | Freq: Once | INTRAVENOUS | Status: AC
  Administered 2019-01-03: 500 mL via INTRAVENOUS

## 2019-01-02 MED ORDER — LIDOCAINE HCL (PF) 1 % IJ SOLN: 30.0000 mL | INTRAMUSCULAR | Status: DC | PRN

## 2019-01-02 MED ORDER — MISOPROSTOL 25 MCG QUARTER TABLET
25.0000 ug | ORAL_TABLET | ORAL | Status: DC
  Administered 2019-01-02: 25 ug via BUCCAL
  Filled 2019-01-02 (×2): qty 1

## 2019-01-02 MED ORDER — LACTATED RINGERS IV SOLN: 500.0000 mL | INTRAVENOUS | Status: DC | PRN

## 2019-01-02 MED ORDER — TERBUTALINE SULFATE 1 MG/ML IJ SOLN: 0.2500 mg | Freq: Once | INTRAMUSCULAR | Status: DC | PRN

## 2019-01-02 MED ORDER — AMMONIA AROMATIC IN INHA
RESPIRATORY_TRACT | Status: AC
  Filled 2019-01-02: qty 10

## 2019-01-02 MED ORDER — LACTATED RINGERS IV SOLN
INTRAVENOUS | Status: DC
  Administered 2019-01-02: 20:00:00 via INTRAVENOUS

## 2019-01-02 MED ORDER — OXYTOCIN 10 UNIT/ML IJ SOLN
INTRAMUSCULAR | Status: AC
  Filled 2019-01-02: qty 2

## 2019-01-02 MED ORDER — SOD CITRATE-CITRIC ACID 500-334 MG/5ML PO SOLN: 30.0000 mL | ORAL | Status: DC | PRN

## 2019-01-02 MED ORDER — PROCHLORPERAZINE MALEATE 10 MG PO TABS
10.0000 mg | ORAL_TABLET | Freq: Four times a day (QID) | ORAL | Status: DC | PRN
  Administered 2019-01-02: 10 mg via ORAL
  Filled 2019-01-02 (×3): qty 1

## 2019-01-02 MED ORDER — OXYTOCIN 40 UNITS IN NORMAL SALINE INFUSION - SIMPLE MED: 1.0000 m[IU]/min | INTRAVENOUS | Status: DC

## 2019-01-02 MED ORDER — MISOPROSTOL 25 MCG QUARTER TABLET
ORAL_TABLET | ORAL | Status: AC
  Filled 2019-01-02: qty 1

## 2019-01-02 MED ORDER — MISOPROSTOL 25 MCG QUARTER TABLET
25.0000 ug | ORAL_TABLET | ORAL | Status: DC | PRN
  Administered 2019-01-02: 25 ug via VAGINAL
  Filled 2019-01-02 (×2): qty 1

## 2019-01-02 MED ORDER — OXYTOCIN 40 UNITS IN NORMAL SALINE INFUSION - SIMPLE MED
2.5000 [IU]/h | INTRAVENOUS | Status: DC
  Filled 2019-01-02: qty 1000

## 2019-01-02 NOTE — OB Triage Note (Signed)
Pt. Presented to L/D triage for elevated BP. Last BP 124/75. She is feeling intermittent, light contractions rated 4/10. She has a constant, frontal headache. Possible epigastric pain present. She reports that his has been ongoing for several weeks. No there PIH symptoms. She reports no bleeding or LOF. Positive fetal movement. VSS. Will continue to monitor.

## 2019-01-03 ENCOUNTER — Inpatient Hospital Stay: Payer: BC Managed Care – PPO | Admitting: Anesthesiology

## 2019-01-03 ENCOUNTER — Encounter: Payer: Self-pay | Admitting: Anesthesiology

## 2019-01-03 LAB — COMPREHENSIVE METABOLIC PANEL
ALT: 9 U/L (ref 0–44)
AST: 13 U/L — ABNORMAL LOW (ref 15–41)
Albumin: 2.6 g/dL — ABNORMAL LOW (ref 3.5–5.0)
Alkaline Phosphatase: 75 U/L (ref 38–126)
Anion gap: 10 (ref 5–15)
BUN: 10 mg/dL (ref 6–20)
CO2: 18 mmol/L — ABNORMAL LOW (ref 22–32)
Calcium: 8.5 mg/dL — ABNORMAL LOW (ref 8.9–10.3)
Chloride: 108 mmol/L (ref 98–111)
Creatinine, Ser: 0.4 mg/dL — ABNORMAL LOW (ref 0.44–1.00)
GFR calc Af Amer: >60 mL/min (ref >60)
GFR calc non Af Amer: >60 mL/min (ref >60)
Glucose, Bld: 85 mg/dL (ref 70–99)
Potassium: 3.9 mmol/L (ref 3.5–5.1)
Sodium: 136 mmol/L (ref 135–145)
Total Bilirubin: 0.3 mg/dL (ref 0.3–1.2)
Total Protein: 5.9 g/dL — ABNORMAL LOW (ref 6.5–8.1)

## 2019-01-03 MED ORDER — WITCH HAZEL-GLYCERIN EX PADS
1.0000 "application " | MEDICATED_PAD | CUTANEOUS | Status: DC
  Administered 2019-01-03: 1 via TOPICAL
  Filled 2019-01-03: qty 100

## 2019-01-03 MED ORDER — HYDROCORTISONE (PERIANAL) 2.5 % EX CREA
TOPICAL_CREAM | Freq: Three times a day (TID) | CUTANEOUS | Status: DC | PRN
  Filled 2019-01-03: qty 28.35

## 2019-01-03 MED ORDER — EPHEDRINE 5 MG/ML INJ: 10.0000 mg | INTRAVENOUS | Status: DC | PRN

## 2019-01-03 MED ORDER — COCONUT OIL OIL: 1.0000 "application " | TOPICAL_OIL | Status: DC | PRN

## 2019-01-03 MED ORDER — LIDOCAINE HCL (PF) 1 % IJ SOLN
INTRAMUSCULAR | Status: DC | PRN
  Administered 2019-01-03: 3 mL

## 2019-01-03 MED ORDER — FENTANYL 2.5 MCG/ML W/ROPIVACAINE 0.15% IN NS 100 ML EPIDURAL (ARMC)
12.0000 mL/h | EPIDURAL | Status: DC
  Administered 2019-01-03: 12 mL/h via EPIDURAL

## 2019-01-03 MED ORDER — PHENYLEPHRINE 40 MCG/ML (10ML) SYRINGE FOR IV PUSH (FOR BLOOD PRESSURE SUPPORT): 80.0000 ug | PREFILLED_SYRINGE | INTRAVENOUS | Status: DC | PRN

## 2019-01-03 MED ORDER — IBUPROFEN 600 MG PO TABS
600.0000 mg | ORAL_TABLET | Freq: Four times a day (QID) | ORAL | Status: DC
  Administered 2019-01-03 – 2019-01-04 (×5): 600 mg via ORAL
  Filled 2019-01-03 (×4): qty 1

## 2019-01-03 MED ORDER — IBUPROFEN 600 MG PO TABS
ORAL_TABLET | ORAL | Status: AC
  Administered 2019-01-03: 600 mg via ORAL
  Filled 2019-01-03: qty 1

## 2019-01-03 MED ORDER — DIBUCAINE (PERIANAL) 1 % EX OINT: 1.0000 "application " | TOPICAL_OINTMENT | CUTANEOUS | Status: DC | PRN

## 2019-01-03 MED ORDER — ONDANSETRON HCL 4 MG PO TABS: 4.0000 mg | ORAL_TABLET | ORAL | Status: DC | PRN

## 2019-01-03 MED ORDER — PRENATAL MULTIVITAMIN CH
1.0000 | ORAL_TABLET | Freq: Every day | ORAL | Status: DC
  Administered 2019-01-03: 1 via ORAL
  Filled 2019-01-03: qty 1

## 2019-01-03 MED ORDER — DIPHENHYDRAMINE HCL 50 MG/ML IJ SOLN
12.5000 mg | INTRAMUSCULAR | Status: DC | PRN
  Administered 2019-01-03: 12.5 mg via INTRAVENOUS
  Filled 2019-01-03: qty 1

## 2019-01-03 MED ORDER — BUPIVACAINE HCL (PF) 0.25 % IJ SOLN
INTRAMUSCULAR | Status: DC | PRN
  Administered 2019-01-03: 10 mL via EPIDURAL

## 2019-01-03 MED ORDER — BENZOCAINE-MENTHOL 20-0.5 % EX AERO
INHALATION_SPRAY | CUTANEOUS | Status: AC
  Filled 2019-01-03: qty 56

## 2019-01-03 MED ORDER — ONDANSETRON HCL 4 MG/2ML IJ SOLN: 4.0000 mg | INTRAMUSCULAR | Status: DC | PRN

## 2019-01-03 MED ORDER — LIDOCAINE-EPINEPHRINE (PF) 1.5 %-1:200000 IJ SOLN
INTRAMUSCULAR | Status: DC | PRN
  Administered 2019-01-03: 3 mL via PERINEURAL

## 2019-01-03 MED ORDER — LACTATED RINGERS IV SOLN: 500.0000 mL | Freq: Once | INTRAVENOUS | Status: DC

## 2019-01-03 MED ORDER — ACETAMINOPHEN 500 MG PO TABS
1000.0000 mg | ORAL_TABLET | Freq: Four times a day (QID) | ORAL | Status: DC | PRN
  Administered 2019-01-03 – 2019-01-04 (×4): 1000 mg via ORAL
  Filled 2019-01-03 (×4): qty 2

## 2019-01-03 MED ORDER — BENZOCAINE-MENTHOL 20-0.5 % EX AERO: 1.0000 "application " | INHALATION_SPRAY | CUTANEOUS | Status: DC | PRN

## 2019-01-03 MED ORDER — SIMETHICONE 80 MG PO CHEW: 80.0000 mg | CHEWABLE_TABLET | ORAL | Status: DC | PRN

## 2019-01-03 MED ORDER — DIPHENHYDRAMINE HCL 25 MG PO CAPS: 25.0000 mg | ORAL_CAPSULE | Freq: Four times a day (QID) | ORAL | Status: DC | PRN

## 2019-01-03 MED ORDER — DOCUSATE SODIUM 100 MG PO CAPS
100.0000 mg | ORAL_CAPSULE | Freq: Two times a day (BID) | ORAL | Status: DC
  Administered 2019-01-03: 100 mg via ORAL
  Filled 2019-01-03: qty 1

## 2019-01-03 NOTE — Anesthesia Preprocedure Evaluation (Addendum)
Anesthesia Evaluation  Patient identified by MRN, date of birth, ID band Patient awake    Reviewed: Allergy & Precautions, NPO status , Patient's Chart, lab work & pertinent test results  Airway Mallampati: II  TM Distance: >3 FB     Dental   Pulmonary neg pulmonary ROS,    Pulmonary exam normal        Cardiovascular hypertension, Normal cardiovascular exam     Neuro/Psych  Headaches, negative psych ROS   GI/Hepatic Neg liver ROS, GERD  ,  Endo/Other    Renal/GU negative Renal ROS  negative genitourinary   Musculoskeletal negative musculoskeletal ROS (+)   Abdominal Normal abdominal exam  (+)   Peds negative pediatric ROS (+)  Hematology negative hematology ROS (+)   Anesthesia Other Findings   Reproductive/Obstetrics (+) Pregnancy                             Anesthesia Physical Anesthesia Plan  ASA: II  Anesthesia Plan: Epidural   Post-op Pain Management:    Induction:   PONV Risk Score and Plan:   Airway Management Planned: Natural Airway  Additional Equipment:   Intra-op Plan:   Post-operative Plan:   Informed Consent: I have reviewed the patients History and Physical, chart, labs and discussed the procedure including the risks, benefits and alternatives for the proposed anesthesia with the patient or authorized representative who has indicated his/her understanding and acceptance.     Dental advisory given  Plan Discussed with: CRNA and Surgeon  Anesthesia Plan Comments:         Anesthesia Quick Evaluation

## 2019-01-03 NOTE — Discharge Summary (Addendum)
Obstetrical Discharge Summary  Patient Name: Karen Wise DOB: 1985/07/15 MRN: [id number]  Date of Admission: 01/02/2019 Date of Delivery: 01/03/2019 Delivered by: Larey Days, MD Date of Discharge: 01/04/2019  Primary OB: [place] [id number] last menstrual period was 03/26/2018. EDC Estimated Date of Delivery: 12/31/18 Gestational Age at Delivery: [redacted]w[redacted]d   Antepartum complications:  1. Obesity 2. History of melanoma 3. Increased risk of DS on screening, NIPT negative 4. Elevated blood pressure  Admitting Diagnosis: induction of labor for elevated blood pressure at term and past due date  Secondary Diagnosis: Patient Active Problem List   Diagnosis Date Noted  . SVD (spontaneous vaginal delivery) 01/04/2019  . Labor and delivery indication for care or intervention 01/02/2019  . Elevated BP without diagnosis of hypertension 12/12/2018  . First trimester screening     Augmentation: AROM and Cytotec Complications: None Intrapartum complications/course: see delivery note Delivery Type: spontaneous vaginal delivery Anesthesia: epidural Placenta: spontaneous Laceration: left labial Episiotomy: none Newborn Data: Live born female "Karen Wise" Birth Weight: 8 lb 2.5 oz (3700 g) APGAR: 8, 9  Newborn Delivery   Birth date/time: 01/03/2019 03:09:00 Delivery type: Vaginal, Spontaneous      Postpartum Procedures: none  Post partum course:  Patient had an uncomplicated postpartum course.  By time of discharge on PPD#1, her pain was controlled on oral pain medications; she had appropriate lochia and was ambulating, voiding without difficulty and tolerating regular diet.  She was deemed stable for discharge to home.     Discharge Physical Exam:  BP 112/69 (BP Location: Left Arm)   Pulse 76   Temp 98.4 F (36.9 C) (Oral)   Resp 18   Ht 5\' 6"  (1.676 m)   Wt 104.3 kg   LMP 03/26/2018   SpO2 99%   Breastfeeding Unknown   BMI 37.12 kg/m   General:  NAD CV: RRR Pulm: CTABL, nl effort ABD: s/nd/nt, fundus firm and below the umbilicus Lochia: moderate DVT Evaluation: LE non-ttp, no evidence of DVT on exam.  Hemoglobin  Date Value Ref Range Status  01/04/2019 9.0 (L) 12.0 - 15.0 g/dL Final   HGB  Date Value Ref Range Status  06/19/2014 13.2 12.0 - 16.0 g/dL Final   HCT  Date Value Ref Range Status  01/04/2019 29.5 (L) 36.0 - 46.0 % Final  06/21/2014 36.7 35.0 - 47.0 % Final     Disposition: stable, discharge to home. Baby Feeding: breastmilk  Baby Disposition: home with mom  Rh Immune globulin given: n/a Rubella vaccine given: n/a Flu vaccine given in AP or PP setting: n/a  Contraception: vasectomy  Prenatal Labs:  Blood type/Rh AB+  Antibody screen neg  Rubella Immune  Varicella Immune  RPR NR  HBsAg Neg  HIV NR  GC neg  Chlamydia neg  Genetic screening Increased risk DS 1/64, cfDNA negative  1 hour GTT 67  3 hour GTT --  GBS negative    Plan:  Karen Wise was discharged to home in good condition. Follow-up appointment with Dr. Leonides Schanz in 6 weeks.  Discharge Medications: Allergies as of 01/04/2019      Reactions   Bee Venom Swelling      Medication List    STOP taking these medications   ondansetron 4 MG disintegrating tablet Commonly known as: ZOFRAN-ODT   pantoprazole 40 MG tablet Commonly known as: PROTONIX     TAKE these medications   acetaminophen 500 MG tablet Commonly known as: TYLENOL Take 2 tablets (1,000 mg total) by mouth every  6 (six) hours as needed for mild pain or moderate pain.   ferrous sulfate 325 (65 FE) MG tablet Take 1 tablet (325 mg total) by mouth daily.   ibuprofen 600 MG tablet Commonly known as: ADVIL Take 1 tablet (600 mg total) by mouth every 6 (six) hours as needed.   prenatal multivitamin Tabs tablet Take 1 tablet by mouth daily at 12 noon.       Follow-up Information    Ward, Honor Loh, MD. Schedule an appointment as soon as possible for a  visit in 6 week(s).   Specialty: Obstetrics and Gynecology Why: For routine postpartum visit Contact information: [place] [address] [phone]           Signed: Lisette Grinder, CNM 01/04/2019 8:41 AM

## 2019-01-03 NOTE — Anesthesia Procedure Notes (Signed)
Epidural Patient location during procedure: OB Start time: 01/03/2019 12:17 AM End time: 01/03/2019 12:29 AM  Staffing Anesthesiologist: Alvin Critchley, MD Performed: anesthesiologist   Preanesthetic Checklist Completed: patient identified, site marked, surgical consent, pre-op evaluation, timeout performed, IV checked, risks and benefits discussed and monitors and equipment checked  Epidural Patient position: sitting Prep: Betadine Patient monitoring: heart rate, continuous pulse ox and blood pressure Approach: midline Location: L3-L4 Injection technique: LOR air  Needle:  Needle type: Tuohy  Needle gauge: 17 G Needle length: 9 cm and 9 Catheter type: closed end flexible Catheter size: 19 Gauge Test dose: negative and 1.5% lidocaine with Epi 1:200 K  Assessment Events: blood not aspirated, injection not painful, no injection resistance, negative IV test and no paresthesia  Additional Notes Time out called.  Patient placed in sitting position.  Back prepped and draped in sterile fashion.  A skin wheal was made in the L3-L4 interspace with 1% Lidocaine plain.  A 17G Tuohy needle was advanced into the epidural space by a loss of resistance technique.  The epidural catheter was threaded 3 cm and the TD was negative.  No blood , fluid or paresthesias noted. The patient tolerated the procedure well and the catheter was affixed to the back in sterile fashion.Reason for block:procedure for pain

## 2019-01-03 NOTE — Lactation Note (Signed)
This note was copied from a baby's chart. Lactation Consultation Note  Patient Name: Karen Wise [id number] Date: 01/03/2019 Reason for consult: Initial assessment   Maternal Data  Has breast feeding experience. She has declined assistance.  Feeding Feeding Type: Breast Fed  [name] 01/03/2019, 1:04 PM

## 2019-01-03 NOTE — Progress Notes (Signed)
Patient ID: Karen Wise, female   DOB: Nov 27, 1985, 33 y.o.   MRN: [id number] Her placenta has been sent to pathology given h/o melanoma

## 2019-01-03 NOTE — H&P (Signed)
OB History & Physical   History of Present Illness:  Chief Complaint:   HPI:  Karen Wise is a 33 y.o. [phone] female at [redacted]w[redacted]d dated by  Patient's last menstrual period was 03/26/2018. consistent with 8wk ultrasound. Estimated Date of Delivery: 12/31/18  She presents to L&D from the office where she had elevated blood pressures and a headache.  Since arrival and observation in triage, she has been normotensive, although headache has persisted, untreated.    +FM, no CTX, no LOF, no VB  Pregnancy Issues: 1. History of melanoma 2. Obesity, prepregnancy BMI 31  Maternal Medical History:   Past Medical History:  Diagnosis Date  . Cancer [facility])    Melonoma 2009  . Headache    Migraines (1 a month)  . Pregnancy induced hypertension     Past Surgical History:  Procedure Laterality Date  . SKIN CANCER EXCISION Left    Shoulder and Lymph Nodes  . WISDOM TOOTH EXTRACTION Bilateral     Allergies  Allergen Reactions  . Bee Venom Swelling    Prior to Admission medications   Medication Sig Start Date End Date Taking? Authorizing Provider  pantoprazole (PROTONIX) 40 MG tablet Take 40 mg by mouth daily.   Yes [provider]  Prenatal Vit-Fe Fumarate-FA (PRENATAL MULTIVITAMIN) TABS tablet Take 1 tablet by mouth daily at 12 noon.   Yes [provider]  ondansetron (ZOFRAN-ODT) 4 MG disintegrating tablet Take 4 mg by mouth every 8 (eight) hours as needed for nausea or vomiting.    [provider]     Prenatal care site: [place]   Social History: She  reports that she has never smoked. She has never used smokeless tobacco. She reports previous alcohol use. She reports that she does not use drugs.  Family History: family history includes ADD / ADHD in her brother and brother; Cancer in her father, maternal grandmother, mother, and paternal grandmother; Heart disease in her maternal grandfather; Hypertension in her maternal grandfather.    Review of Systems: A full review of systems was performed and negative except as noted in the HPI.     Physical Exam:  Vital Signs: BP 118/72   Pulse 73   Temp 98 F (36.7 C) (Oral)   Resp 16   Ht 5\' 6"  (1.676 m)   Wt 104.3 kg   LMP 03/26/2018   SpO2 97%   Breastfeeding Unknown   BMI 37.12 kg/m  General: no acute distress.  HEENT: normocephalic, atraumatic Heart: regular rate & rhythm.  No murmurs/rubs/gallops Lungs: clear to auscultation bilaterally, normal respiratory effort Abdomen: soft, gravid, non-tender;  EFW: 8# Pelvic:   External: Normal external female genitalia  Cervix: 1/ long / high   Extremities: non-tender, symmetric, 2+edema bilaterally.  DTRs: 2+  Neurologic: Alert & oriented x 3.    Results for orders placed or performed during the hospital encounter of 01/02/19 (from the past 24 hour(s))  SARS Coronavirus 2 [facility] order, Performed in [facility] hospital lab) Nasopharyngeal Nasopharyngeal Swab     Status: None   Collection Time: 01/02/19  5:48 PM   Specimen: Nasopharyngeal Swab  Result Value Ref Range   SARS Coronavirus 2 NEGATIVE NEGATIVE  Protein / creatinine ratio, urine     Status: None   Collection Time: 01/02/19  5:48 PM  Result Value Ref Range   Creatinine, Urine 42 mg/dL   Total Protein, Urine <6 mg/dL   Protein Creatinine Ratio        0.00 -  0.15 mg/mg[Cre]  CBC     Status: Abnormal   Collection Time: 01/02/19  5:52 PM  Result Value Ref Range   WBC 16.4 (H) 4.0 - 10.5 K/uL   RBC 4.07 3.87 - 5.11 MIL/uL   Hemoglobin 10.3 (L) 12.0 - 15.0 g/dL   HCT 32.0 (L) 36.0 - 46.0 %   MCV 78.6 (L) 80.0 - 100.0 fL   MCH 25.3 (L) 26.0 - 34.0 pg   MCHC 32.2 30.0 - 36.0 g/dL   RDW 14.3 11.5 - 15.5 %   Platelets 272 150 - 400 K/uL   nRBC 0.0 0.0 - 0.2 %  Type and screen [place]     Status: None   Collection Time: 01/02/19  5:52 PM  Result Value Ref Range   ABO/RH(D) AB POS    Antibody Screen NEG    Sample Expiration       01/05/2019,2359 Performed at [facility], [address], [place], [place] [address]     Pertinent Results:  Prenatal Labs: Blood type/Rh AB+  Antibody screen neg  Rubella Immune  Varicella Immune  RPR NR  HBsAg Neg  HIV NR  GC neg  Chlamydia neg  Genetic screening Increased risk DS 1/64, cfDNA negative  1 hour GTT 67  3 hour GTT --  GBS negative   FHT: 140 mod + accels no decels TOCO: rare   Cephalic by leopolds  Assessment:  Karen Wise is a 33 y.o. G41P1012 female at [redacted]w[redacted]d with elevated blood pressure and past due date for induction of labor .   Plan:  1. Admit to Labor & Delivery for IOL 2. CBC, T&S, Clrs, IVF 3. GBS negative 4. Consents obtained. 5. Continuous efm/toco 6. Covid testing pending 7. Category 1 8. BP: thus far normotensive since in triage. PIH labs neg 9. Compazine for headache 10. cytotec for cervical ripening  ----- Larey Days, MD Attending Obstetrician and Gynecologist [facility], Department of [facility]

## 2019-01-04 LAB — RPR: RPR Ser Ql: NONREACTIVE

## 2019-01-04 LAB — CBC
HCT: 29.5 % — ABNORMAL LOW (ref 36.0–46.0)
Hemoglobin: 9 g/dL — ABNORMAL LOW (ref 12.0–15.0)
MCH: 24.7 pg — ABNORMAL LOW (ref 26.0–34.0)
MCHC: 30.5 g/dL (ref 30.0–36.0)
MCV: 81 fL (ref 80.0–100.0)
Platelets: 211 10*3/uL (ref 150–400)
RBC: 3.64 MIL/uL — ABNORMAL LOW (ref 3.87–5.11)
RDW: 14.5 % (ref 11.5–15.5)
WBC: 13.8 10*3/uL — ABNORMAL HIGH (ref 4.0–10.5)
nRBC: 0 % (ref 0.0–0.2)

## 2019-01-04 MED ORDER — ACETAMINOPHEN 500 MG PO TABS: 1000.0000 mg | ORAL_TABLET | Freq: Four times a day (QID) | ORAL | 0 refills | Status: AC | PRN

## 2019-01-04 MED ORDER — IBUPROFEN 600 MG PO TABS: 600.0000 mg | ORAL_TABLET | Freq: Four times a day (QID) | ORAL | 0 refills | Status: AC | PRN

## 2019-01-04 MED ORDER — FERROUS SULFATE 325 (65 FE) MG PO TABS: 325.0000 mg | ORAL_TABLET | Freq: Every day | ORAL | 0 refills | Status: AC

## 2019-01-04 NOTE — Progress Notes (Signed)
Pt discharged with infant. Discharge instructions, prescriptions, and follow up appointments given to and reviewed with patient. Pt verbalized understanding. Escorted out by staff. 

## 2019-01-04 NOTE — Anesthesia Postprocedure Evaluation (Signed)
Anesthesia Post Note  Patient: Karen Wise  Procedure(s) Performed: AN AD [place]  Patient location during evaluation: Mother Baby Anesthesia Type: Epidural Level of consciousness: awake and alert Pain management: pain level controlled Vital Signs Assessment: post-procedure vital signs reviewed and stable Respiratory status: spontaneous breathing, nonlabored ventilation and respiratory function stable Cardiovascular status: stable Postop Assessment: no headache, no backache and epidural receding Anesthetic complications: no     Last Vitals:  Vitals:   01/03/19 2335 01/04/19 0754  BP: 134/77 112/69  Pulse: 87 76  Resp: 18 18  Temp: 36.9 C 36.9 C  SpO2: 99% 99%    Last Pain:  Vitals:   01/04/19 0754  TempSrc: Oral  PainSc:                  Hedda Slade

## 2019-01-04 NOTE — Discharge Instructions (Signed)
After Your Delivery Discharge Instructions   Postpartum: Care Instructions  After childbirth (postpartum period), your body goes through many changes. Some of these changes happen over several weeks. In the hours after delivery, your body will begin to recover from childbirth while it prepares to breastfeed your newborn. You may feel emotional during this time. Your hormones can shift your mood without warning for no clear reason.  In the first couple of weeks after childbirth, many women have emotions that change from happy to sad. You may find it hard to sleep. You may cry a lot. This is called the "baby blues." These overwhelming emotions often go away within a couple of days or weeks. But it's important to discuss your feelings with your doctor.  You should call your care provider if you have unrelieved feelings of:  Inability to cope  Sadness  Anxiety  Lack of interest in baby  Insomnia  Crying  It is easy to get too tired and overwhelmed during the first weeks after childbirth. Don't try to do too much. Get rest whenever you can, accept help from others, and eat well and drink plenty of fluids.  About 4 to 6 weeks after your baby's birth, you will have a follow-up visit with your care provider. This visit is your time to talk to your provider about anything you are concerned or curious about.  Follow-up care is a key part of your treatment and safety. Be sure to make and go to all appointments, and call your doctor if you are having problems. It's also a good idea to know your test results and keep a list of the medicines you take.  How can you care for yourself at home?  Sleep or rest when your baby sleeps.  Get help with household chores from family or friends, if you can. Do not try to do it all yourself.  If you have hemorrhoids or swelling or pain around the opening of your vagina, try using cold and heat. You can put ice or a cold pack on the area for 10 to 20 minutes at  a time. Put a thin cloth between the ice and your skin. Also try sitting in a few inches of warm water (sitz bath) 3 times a day and after bowel movements.  Take pain medicines exactly as directed.  If the provider gave you a prescription medicine for pain, take it as prescribed.  If you do not have a prescription and need something over the counter, you can take:  Ibuprofen (Motrin, Advil) up to '600mg'$  every 6 hours as needed for pain  Acetaminophen (Tylenol) up to '650mg'$  every 4 hours as needed for pain  Some people find it helpful to alternate between these two medications.   No driving for 1-2 weeks or while taking pain medications.   Eat more fiber to avoid constipation. Include foods such as whole-grain breads and cereals, raw vegetables, raw and dried fruits, and beans.  Drink plenty of fluids, enough so that your urine is light yellow or clear like water. If you have kidney, heart, or liver disease and have to limit fluids, talk with your doctor before you increase the amount of fluids you drink.  Do not put anything in the vagina for 6 weeks. This means no sex, no tampons, no douching, and no enemas.  If you have stitches, keep the area clean by pouring or spraying warm water over the area outside your vagina and anus after you use the toilet.  No strenuous activity or heavy lifting for 6 weeks   No tub baths; showers only  Continue prenatal vitamin and iron.  If breastfeeding:  Increase calories and fluids while breastfeeding.  You may have a slight fever when your milk comes in, but it should go away on its own. If it does not, and rises above 101.0 please call the doctor.  For breastfeeding concerns, the lactation consultant can be reached at [phone].  For concerns about your baby, please call your pediatrician.   Keep a list of questions to bring to your postpartum visit. Your questions might be about:  Changes in your breasts, such as lumps or  soreness.  When to expect your menstrual period to start again.  What form of birth control is best for you.  Weight you have put on during the pregnancy.  Exercise options.  What foods and drinks are best for you, especially if you are breastfeeding.  Problems you might be having with breastfeeding.  When you can have sex. Some women may want to talk about lubricants for the vagina.  Any feelings of sadness or restlessness that you are having.   When should you call for help?  Call 911 anytime you think you may need emergency care. For example, call if:  You have thoughts of harming yourself, your baby, or another person.  You passed out (lost consciousness).  Call the office at [phone] or seek immediate medical care if:  If you have heavy bleeding such that you are soaking 1 pad in an hour for 2 hours  You are dizzy or lightheaded, or you feel like you may faint.  You have a fever; a temperature of 101.0 F or greater  Chills  Difficulty urinating  Headache unrelieved by "pain meds"   Visual changes  Pain in the right side of your belly near your ribs  Breasts reddened, hard, hot to the touch or any other breast concerns  Nipple discharge which is foul-smelling or contains pus   Increased pain at the site of the tear   New pain unrelieved with recommended over-the-counter dosages  Difficulty breathing with or without chest pain   New leg pain, swelling, or redness, especially if it is only on one leg  Any other concerns  Watch closely for changes in your health, and be sure to contact your provider if:  You have new or worse vaginal discharge.  You feel sad or depressed.  You are having problems with your breasts or breastfeeding.

## 2019-01-29 LAB — SURGICAL PATHOLOGY

## 2020-09-25 ENCOUNTER — Other Ambulatory Visit: Payer: Self-pay | Admitting: Neurology

## 2020-09-25 ENCOUNTER — Other Ambulatory Visit (HOSPITAL_COMMUNITY): Payer: Self-pay | Admitting: Neurology

## 2020-09-25 DIAGNOSIS — R200 Anesthesia of skin: Secondary | ICD-10-CM

## 2020-09-30 ENCOUNTER — Ambulatory Visit: Admission: RE | Admit: 2020-09-30 | Payer: BC Managed Care – PPO | Source: Ambulatory Visit

## 2020-10-02 ENCOUNTER — Other Ambulatory Visit: Payer: Self-pay

## 2020-10-02 ENCOUNTER — Ambulatory Visit
Admission: RE | Admit: 2020-10-02 | Discharge: 2020-10-02 | Disposition: A | Discharge status: Home / Self Care | Payer: BC Managed Care – PPO | Source: Ambulatory Visit | Attending: Neurology | Admitting: Neurology

## 2020-10-02 DIAGNOSIS — R200 Anesthesia of skin: Secondary | ICD-10-CM | POA: Diagnosis present

## 2020-10-02 MED ORDER — GADOBUTROL 1 MMOL/ML IV SOLN
10.0000 mL | Freq: Once | INTRAVENOUS | Status: AC | PRN
  Administered 2020-10-02: 10 mL via INTRAVENOUS

## 2022-01-10 IMAGING — MR MR HEAD WO/W CM
14 series · 48 of 48 positions shown · IV contrast (gadavist)
Comparison: None.

CLINICAL DATA: Left-sided facial numbness over the last year

EXAM:
MRI HEAD WITHOUT AND WITH CONTRAST
TECHNIQUE: Multiplanar, multiecho pulse sequences of the brain and surrounding
structures were obtained without and with intravenous contrast.
CONTRAST:  10mL GADAVIST GADOBUTROL 1 MMOL/ML IV SOLN

[Series 2: DWI · axial · 3.0mm · 1.20mm/px · z∈[-78,+82]mm · 6 of 110 slices shown (1 of 4)]
[im 1/110]
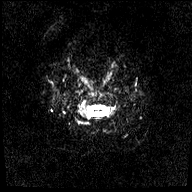
[im 22/110]
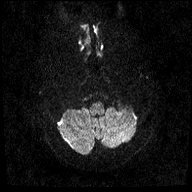
[im 44/110]
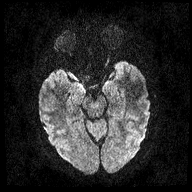
[im 66/110]
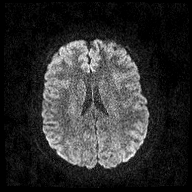
[im 88/110]
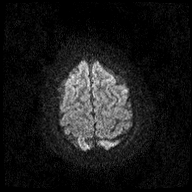
[im 110/110]
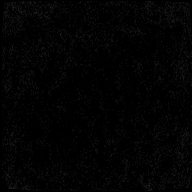

[Series 3: DWI · axial · 3.0mm · 1.20mm/px · z∈[-78,+82]mm · 3 of 55 slices shown (2 of 4)]
[im 1/55]
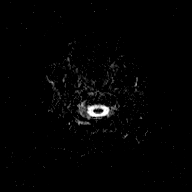
[im 28/55]
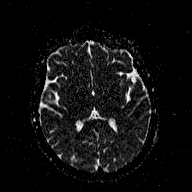
[im 55/55]
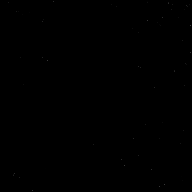

[Series 4: DWI · coronal · 3.0mm · 1.14mm/px · 5 of 90 slices shown (3 of 4)]
[im 1/90]
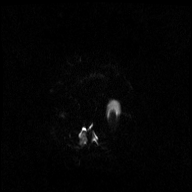
[im 23/90]
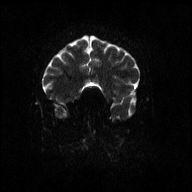
[im 45/90]
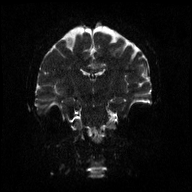
[im 67/90]
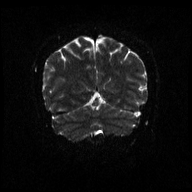
[im 90/90]
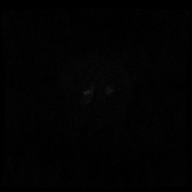

[Series 5: DWI · coronal · 3.0mm · 1.14mm/px · 3 of 45 slices shown (4 of 4)]
[im 1/45]
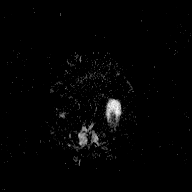
[im 23/45]
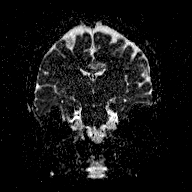
[im 45/45]
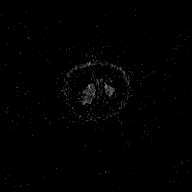

[Series 6: T1 · sagittal · 5.0mm · 0.45mm/px · 1 of 23 slices shown (1 of 3)]
[im 1/23]
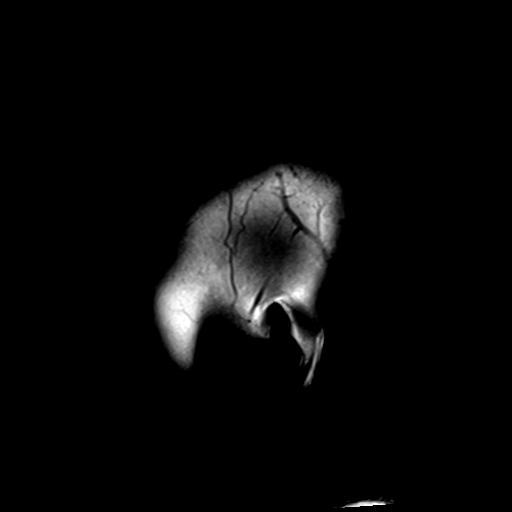

[Series 7: FLAIR · sagittal · 4.0mm · 0.45mm/px · 2 of 30 slices shown (1 of 2)]
[im 1/30]
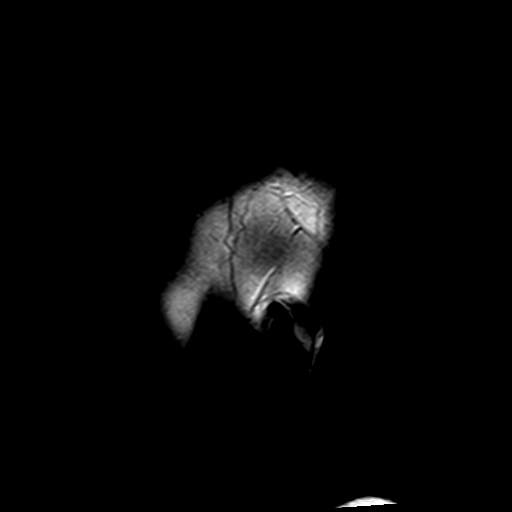
[im 30/30]
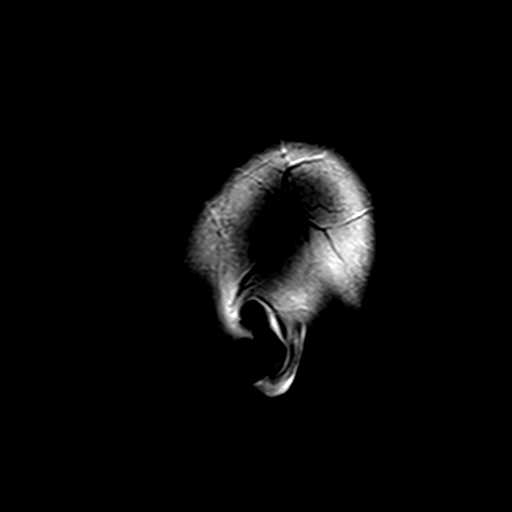

[Series 8: T2 · axial · 5.0mm · 0.72mm/px · 1 of 23 slices shown (1 of 2)]
[im 1/23]
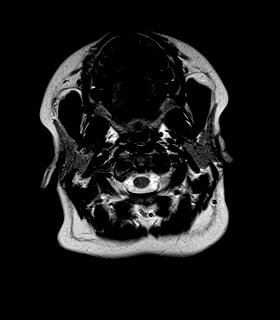

[Series 9: FLAIR · axial · 3.0mm · 0.45mm/px · z∈[-78,+81]mm · 3 of 55 slices shown (2 of 2)]
[im 1/55]
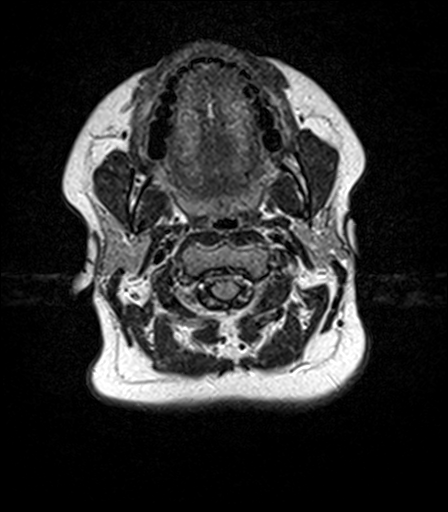
[im 28/55]
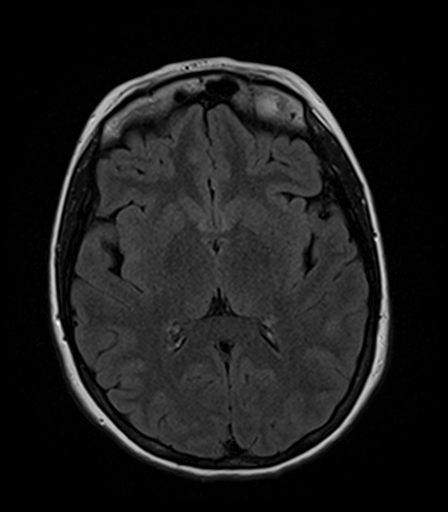
[im 55/55]
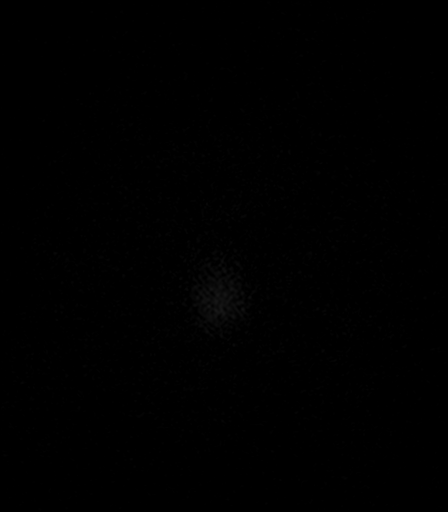

[Series 10: T2 · axial · 5.0mm · 0.72mm/px · 1 of 23 slices shown (2 of 2)]
[im 1/23]
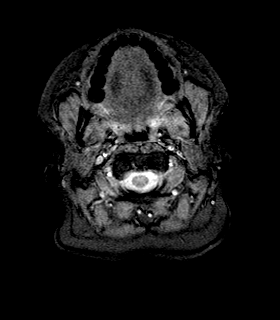

[Series 11: T1 · axial · 1.0mm · 1.00mm/px · z∈[-79,+78]mm · 9 of 160 slices shown (2 of 3)]
[im 1/160]
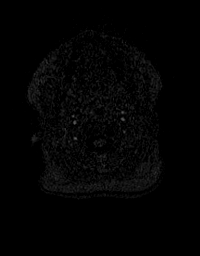
[im 20/160]
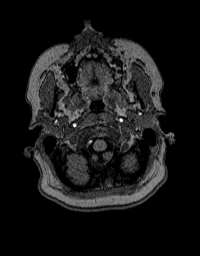
[im 40/160]
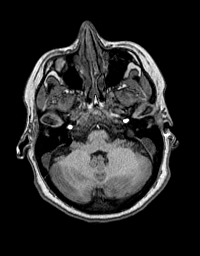
[im 60/160]
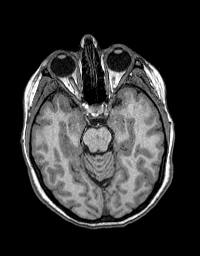
[im 80/160]
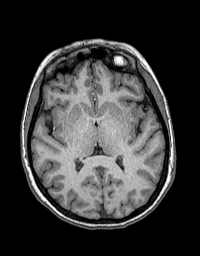
[im 100/160]
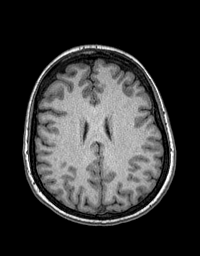
[im 120/160]
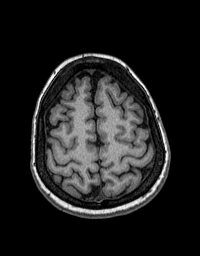
[im 140/160]
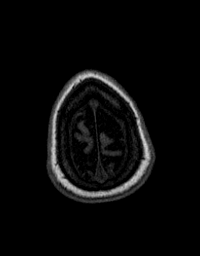
[im 160/160]
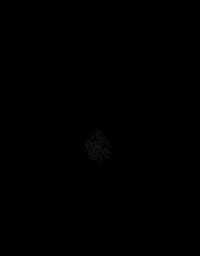

[Series 12: T2 post-contrast · coronal · 5.0mm · 0.43mm/px · 2 of 31 slices shown]
[im 1/31]
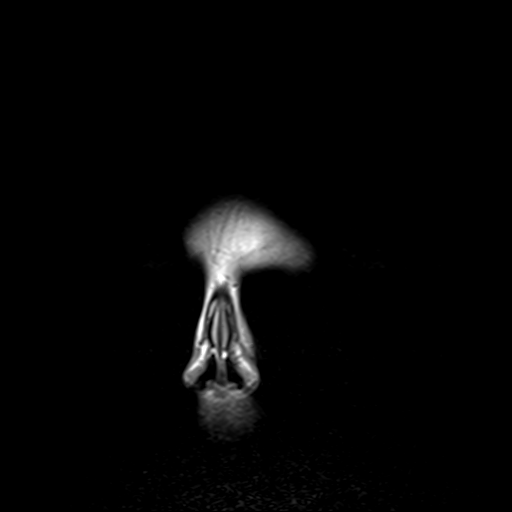
[im 31/31]
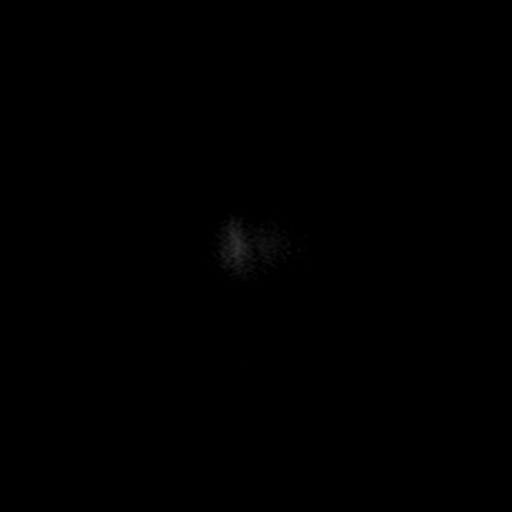

[Series 13: T1 post-contrast · axial · 1.0mm · 1.00mm/px · z∈[-79,+78]mm · 9 of 160 slices shown (1 of 2)]
[im 1/160]
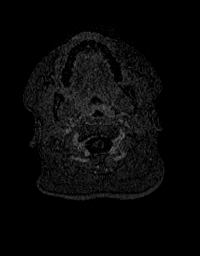
[im 20/160]
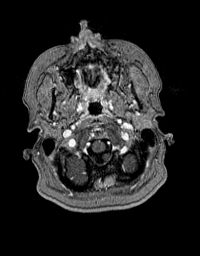
[im 40/160]
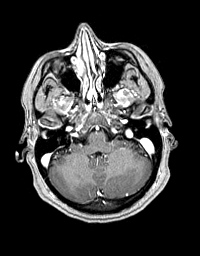
[im 60/160]
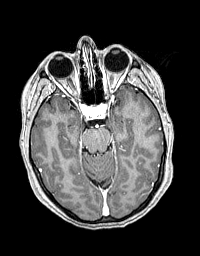
[im 80/160]
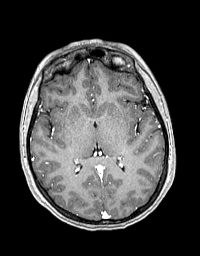
[im 100/160]
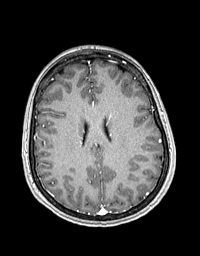
[im 120/160]
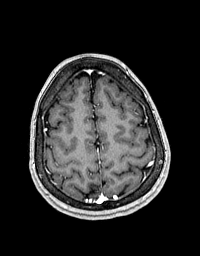
[im 140/160]
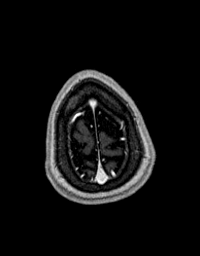
[im 160/160]
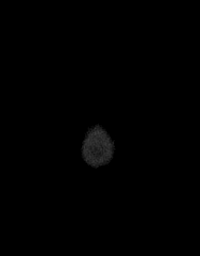

[Series 14: T1 post-contrast · coronal · 5.0mm · 0.43mm/px · 2 of 31 slices shown (2 of 2)]
[im 1/31]
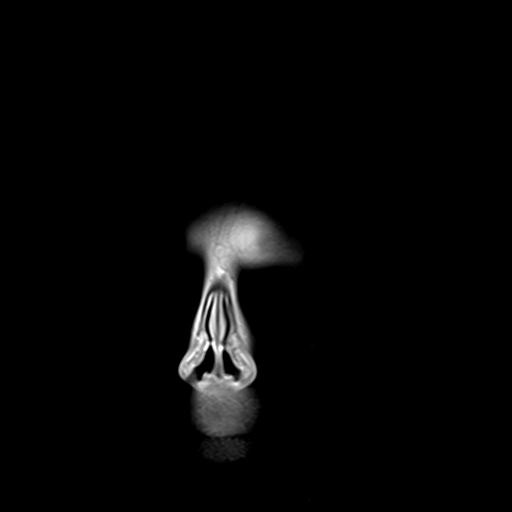
[im 31/31]
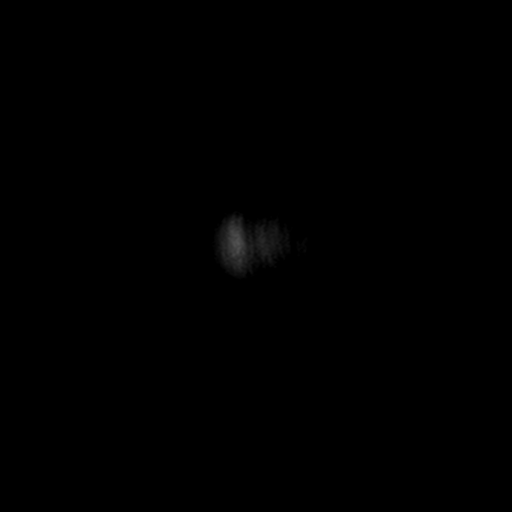

[Series 15: T1 · sagittal · 5.0mm · 0.45mm/px · 1 of 23 slices shown (3 of 3)]
[im 1/23]
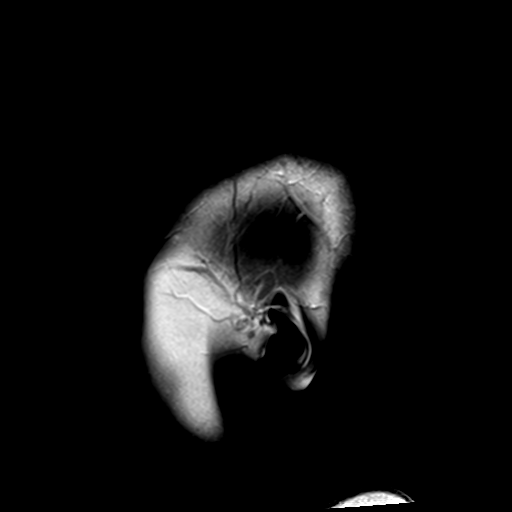

[48 of 48 positions shown; findings below may reference images not displayed]

FINDINGS: Brain: The brain has a normal appearance without evidence of
malformation, atrophy, old or acute small or large vessel
infarction, mass lesion, hemorrhage, hydrocephalus or extra-axial
collection. No sign of demyelinating disease. After contrast
administration, no abnormal enhancement occurs affecting the brain,
leptomeninges or cranial nerves.

Vascular: Major vessels at the base of the brain show flow. Venous
sinuses appear patent.

Skull and upper cervical spine: Normal.

Sinuses/Orbits: Clear/normal.

Other: None significant.
IMPRESSION: Normal examination. No abnormality seen to explain the clinical
presentation.
